# Patient Record
Sex: Male | Born: 1945 | Race: White | Hispanic: No | Marital: Single
Health system: Southern US, Community
[De-identification: ages and names within clinical notes are randomized; demographics above are authoritative.]

---

## 2011-04-03 ENCOUNTER — Ambulatory Visit (HOSPITAL_COMMUNITY)
Admission: RE | Admit: 2011-04-03 | Discharge: 2011-04-03 | Disposition: A | Discharge status: Home / Self Care | Payer: Medicare Other | Source: Ambulatory Visit | Attending: Family Medicine | Admitting: Family Medicine

## 2011-04-03 ENCOUNTER — Other Ambulatory Visit (HOSPITAL_COMMUNITY): Payer: Self-pay | Admitting: Family Medicine

## 2011-04-03 DIAGNOSIS — R059 Cough, unspecified: Secondary | ICD-10-CM

## 2011-04-03 DIAGNOSIS — F17200 Nicotine dependence, unspecified, uncomplicated: Secondary | ICD-10-CM

## 2011-04-03 DIAGNOSIS — R05 Cough: Secondary | ICD-10-CM

## 2011-04-03 DIAGNOSIS — R918 Other nonspecific abnormal finding of lung field: Secondary | ICD-10-CM | POA: Insufficient documentation

## 2012-04-25 ENCOUNTER — Ambulatory Visit (HOSPITAL_COMMUNITY)
Admission: RE | Admit: 2012-04-25 | Discharge: 2012-04-25 | Disposition: A | Discharge status: Home / Self Care | Payer: Medicare Other | Source: Ambulatory Visit | Attending: Family Medicine | Admitting: Family Medicine

## 2012-04-25 ENCOUNTER — Other Ambulatory Visit (HOSPITAL_COMMUNITY): Payer: Self-pay | Admitting: Family Medicine

## 2012-04-25 DIAGNOSIS — Z87891 Personal history of nicotine dependence: Secondary | ICD-10-CM | POA: Insufficient documentation

## 2012-07-09 ENCOUNTER — Other Ambulatory Visit (HOSPITAL_COMMUNITY): Payer: Self-pay | Admitting: Family Medicine

## 2012-07-09 DIAGNOSIS — F17200 Nicotine dependence, unspecified, uncomplicated: Secondary | ICD-10-CM

## 2012-07-09 DIAGNOSIS — E785 Hyperlipidemia, unspecified: Secondary | ICD-10-CM

## 2012-07-15 ENCOUNTER — Ambulatory Visit (HOSPITAL_COMMUNITY)
Admission: RE | Admit: 2012-07-15 | Discharge: 2012-07-15 | Disposition: A | Discharge status: Home / Self Care | Payer: Medicare Other | Source: Ambulatory Visit | Attending: Family Medicine | Admitting: Family Medicine

## 2012-07-15 DIAGNOSIS — I714 Abdominal aortic aneurysm, without rupture, unspecified: Secondary | ICD-10-CM | POA: Insufficient documentation

## 2012-07-15 DIAGNOSIS — I700 Atherosclerosis of aorta: Secondary | ICD-10-CM | POA: Insufficient documentation

## 2012-07-15 DIAGNOSIS — E785 Hyperlipidemia, unspecified: Secondary | ICD-10-CM

## 2012-07-15 DIAGNOSIS — F17200 Nicotine dependence, unspecified, uncomplicated: Secondary | ICD-10-CM

## 2013-02-05 ENCOUNTER — Ambulatory Visit (HOSPITAL_COMMUNITY)
Admission: RE | Admit: 2013-02-05 | Discharge: 2013-02-05 | Disposition: A | Discharge status: Home / Self Care | Payer: Medicare Other | Source: Ambulatory Visit | Attending: Family Medicine | Admitting: Family Medicine

## 2013-02-05 ENCOUNTER — Other Ambulatory Visit (HOSPITAL_COMMUNITY): Payer: Self-pay | Admitting: Family Medicine

## 2013-02-05 DIAGNOSIS — M545 Low back pain, unspecified: Secondary | ICD-10-CM | POA: Insufficient documentation

## 2013-02-05 DIAGNOSIS — IMO0002 Reserved for concepts with insufficient information to code with codable children: Secondary | ICD-10-CM

## 2013-02-05 DIAGNOSIS — M5137 Other intervertebral disc degeneration, lumbosacral region: Secondary | ICD-10-CM | POA: Insufficient documentation

## 2013-02-05 DIAGNOSIS — M51379 Other intervertebral disc degeneration, lumbosacral region without mention of lumbar back pain or lower extremity pain: Secondary | ICD-10-CM | POA: Insufficient documentation

## 2013-02-11 ENCOUNTER — Other Ambulatory Visit (HOSPITAL_COMMUNITY): Payer: Self-pay | Admitting: Family Medicine

## 2013-02-11 DIAGNOSIS — IMO0002 Reserved for concepts with insufficient information to code with codable children: Secondary | ICD-10-CM

## 2013-02-11 DIAGNOSIS — M545 Low back pain: Secondary | ICD-10-CM

## 2013-02-14 ENCOUNTER — Encounter (HOSPITAL_COMMUNITY): Payer: Self-pay

## 2013-02-14 ENCOUNTER — Ambulatory Visit (HOSPITAL_COMMUNITY)
Admission: RE | Admit: 2013-02-14 | Discharge: 2013-02-14 | Disposition: A | Discharge status: Home / Self Care | Payer: Medicare Other | Source: Ambulatory Visit | Attending: Family Medicine | Admitting: Family Medicine

## 2013-02-14 DIAGNOSIS — IMO0002 Reserved for concepts with insufficient information to code with codable children: Secondary | ICD-10-CM

## 2013-02-14 DIAGNOSIS — M545 Low back pain, unspecified: Secondary | ICD-10-CM

## 2013-02-14 DIAGNOSIS — M5126 Other intervertebral disc displacement, lumbar region: Secondary | ICD-10-CM | POA: Insufficient documentation

## 2013-02-14 DIAGNOSIS — M51379 Other intervertebral disc degeneration, lumbosacral region without mention of lumbar back pain or lower extremity pain: Secondary | ICD-10-CM | POA: Insufficient documentation

## 2013-02-14 DIAGNOSIS — M5380 Other specified dorsopathies, site unspecified: Secondary | ICD-10-CM | POA: Insufficient documentation

## 2013-02-14 DIAGNOSIS — M5137 Other intervertebral disc degeneration, lumbosacral region: Secondary | ICD-10-CM | POA: Insufficient documentation

## 2013-02-14 LAB — POCT I-STAT, CHEM 8
Glucose, Bld: 86 mg/dL (ref 70–99)
HCT: 53 % — ABNORMAL HIGH (ref 39.0–52.0)
Hemoglobin: 18 g/dL — ABNORMAL HIGH (ref 13.0–17.0)
Potassium: 4.3 mEq/L (ref 3.5–5.1)
Sodium: 137 mEq/L (ref 135–145)
TCO2: 23 mmol/L (ref 0–100)

## 2013-02-14 MED ORDER — GADOBENATE DIMEGLUMINE 529 MG/ML IV SOLN
13.0000 mL | Freq: Once | INTRAVENOUS | Status: AC | PRN
  Administered 2013-02-14: 13 mL via INTRAVENOUS

## 2014-02-12 ENCOUNTER — Ambulatory Visit (HOSPITAL_COMMUNITY)
Admission: RE | Admit: 2014-02-12 | Discharge: 2014-02-12 | Disposition: A | Discharge status: Home / Self Care | Payer: Medicare Other | Source: Ambulatory Visit | Attending: Family Medicine | Admitting: Family Medicine

## 2014-02-12 ENCOUNTER — Other Ambulatory Visit (HOSPITAL_COMMUNITY): Payer: Self-pay | Admitting: Family Medicine

## 2014-02-12 DIAGNOSIS — J449 Chronic obstructive pulmonary disease, unspecified: Secondary | ICD-10-CM | POA: Diagnosis not present

## 2014-02-12 DIAGNOSIS — J4489 Other specified chronic obstructive pulmonary disease: Secondary | ICD-10-CM | POA: Insufficient documentation

## 2014-02-12 DIAGNOSIS — I700 Atherosclerosis of aorta: Secondary | ICD-10-CM | POA: Insufficient documentation

## 2014-02-12 DIAGNOSIS — F17210 Nicotine dependence, cigarettes, uncomplicated: Secondary | ICD-10-CM

## 2014-02-12 DIAGNOSIS — F17200 Nicotine dependence, unspecified, uncomplicated: Secondary | ICD-10-CM | POA: Insufficient documentation

## 2015-02-13 IMAGING — CR DG CHEST 2V
2 series · 2 of 2 positions shown · non-contrast
Comparison: 04/25/2012

CLINICAL DATA: Smoker, COPD

EXAM:
CHEST - 2 VIEW

[view not recorded (1 of 2)]
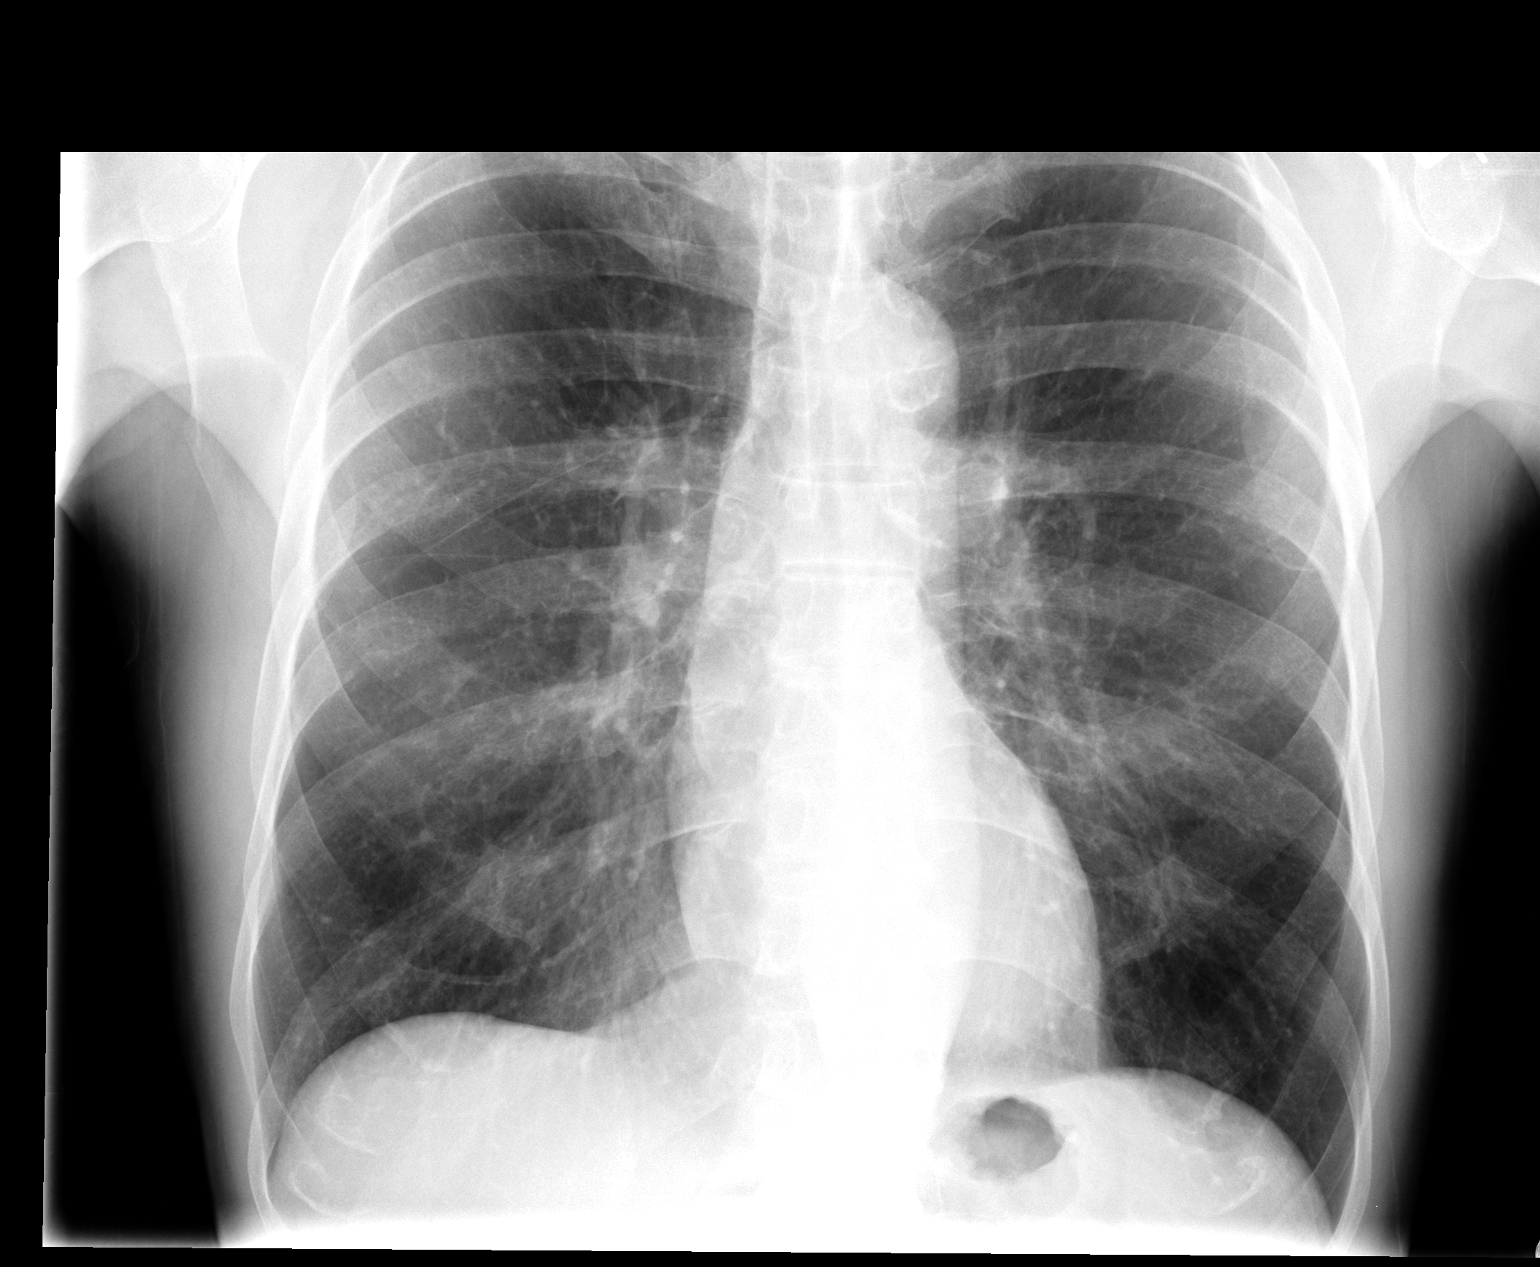

[view not recorded (2 of 2)]
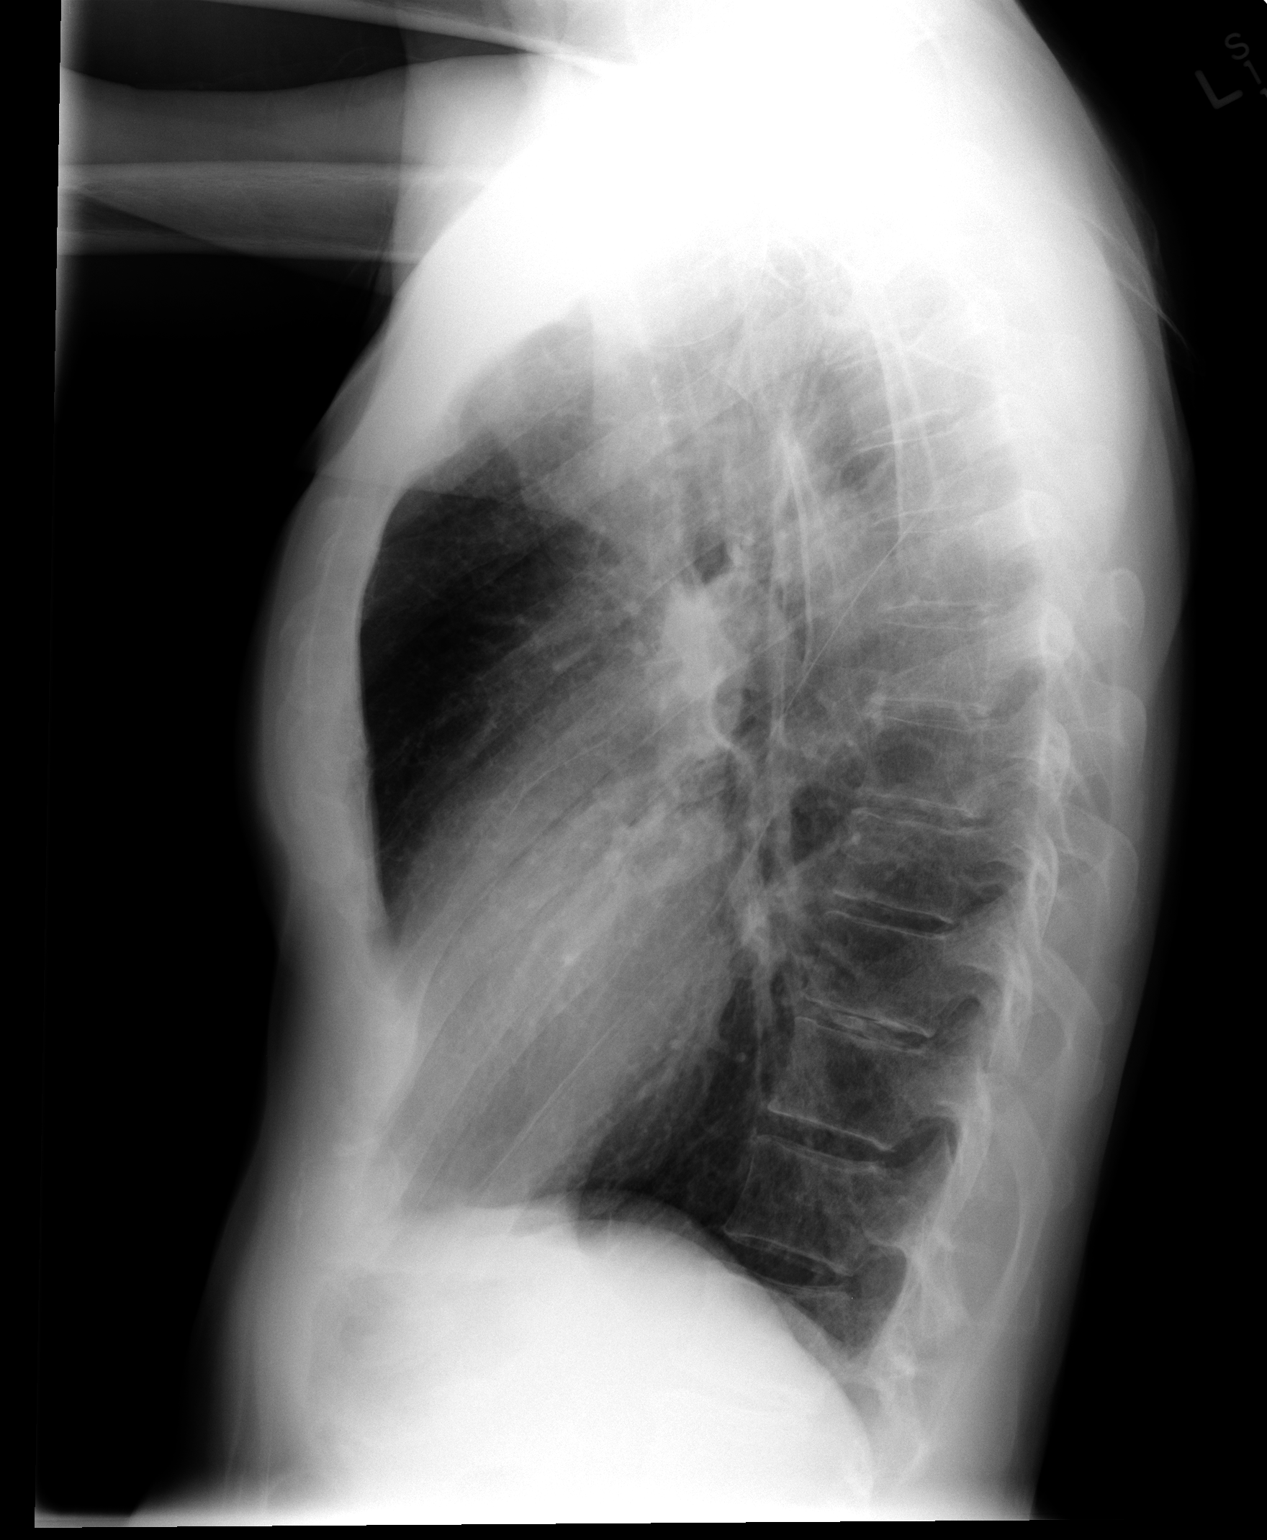

[2 of 2 positions shown; findings below may reference images not displayed]

FINDINGS: Stable hyperinflation. Normal heart size and vascularity. No focal
pneumonia, collapse or consolidation. No effusion or pneumothorax.
Trachea midline. Aortic atherosclerosis noted. No osseous
abnormality.
IMPRESSION: Stable hyperinflation.  No interval change.
